# Patient Record
Sex: Female | Born: 1970 | Race: Asian | Hispanic: No | Marital: Married | Smoking: Never smoker
Health system: Southern US, Community
[De-identification: ages and names within clinical notes are randomized; demographics above are authoritative.]

## PROBLEM LIST (undated history)

## (undated) DIAGNOSIS — K219 Gastro-esophageal reflux disease without esophagitis: Secondary | ICD-10-CM

## (undated) DIAGNOSIS — E079 Disorder of thyroid, unspecified: Secondary | ICD-10-CM

## (undated) DIAGNOSIS — I7300 Raynaud's syndrome without gangrene: Secondary | ICD-10-CM

## (undated) HISTORY — DX: Gastro-esophageal reflux disease without esophagitis: K21.9

## (undated) HISTORY — DX: Disorder of thyroid, unspecified: E07.9

## (undated) HISTORY — DX: Raynaud's syndrome without gangrene: I73.00

---

## 2006-06-27 LAB — CYTOLOGY - PAP: PAP SMEAR: NEGATIVE

## 2009-04-21 LAB — CBC WITH DIFFERENTIAL
Basophils Absolute: 0 /uL
Eosinophils Absolute: 0 /uL
HCT: 40 %
Hgb Other: 13.4
LYMPHO ABS: 2 /uL
MCV: 85.8
Monocytes Absolute: 0 /uL
NEUTRO ABS: 2 /uL
PLATELETS: 153
RBC: 4.68
RDW: 13.1
WBC: 3.9

## 2009-04-21 LAB — POTASSIUM (ARMC VASCULAR LAB ONLY)
Bicarbonate: 30
CHLORIDE: 99 mmol/L
Potassium: 3.9 mmol/L
Sodium: 139

## 2009-04-21 LAB — SEDIMENTATION RATE: SED RATE: 14

## 2009-04-21 LAB — LIPID PANEL WITH LDL/HDL RATIO
Cholesterol: 147
HDL: 28 mg/dL — AB (ref 35–70)
LDL (calc): 90
Triglycerides: 144

## 2009-04-21 LAB — GLUCOSE, FASTING: Glucose: 90

## 2009-04-21 LAB — HM PAP SMEAR: PAP SMEAR: NEGATIVE

## 2009-04-21 LAB — TSH: TSH: 4.84

## 2009-04-26 LAB — ANTI-NUCLEAR AB-TITER (ANA TITER): ANA Titer 1: 1:160 {titer}

## 2009-04-26 LAB — ANA: ANA: POSITIVE

## 2010-08-31 LAB — LIPID PANEL
Cholesterol: 126
HDL: 19
LDL Calculated: 72 mg/dL
Triglycerides: 173

## 2010-08-31 LAB — HEMOGLOBIN A1C: Hemoglobin A1C: 5.8

## 2011-01-08 LAB — CENTROMERE ANTIBODIES: CENTROMERE AB: 121

## 2012-05-06 LAB — PAP, THINPREP RFLX HPV
HPV, HIGH-RISK: NOT DETECTED
PAP SMEAR: NEGATIVE

## 2012-06-18 LAB — LIPID PANEL
CHOLESTEROL: 148
HDL: 35
LDL: 89
Triglycerides: 122

## 2012-06-18 LAB — TSH: TSH: 0.29

## 2012-06-18 LAB — T3, FREE: T3 FREE INDEX: 2.5

## 2012-06-18 LAB — GLUCOSE, FASTING: Glucose: 84

## 2012-08-01 IMAGING — US US OUTSIDE FILMS BODY
1 series · 2 of 2 positions shown · non-contrast
Comparison: none

[Series 1: 76645.0 us breast unilateral left · 2 of 2 slices shown]
[im 1/2]
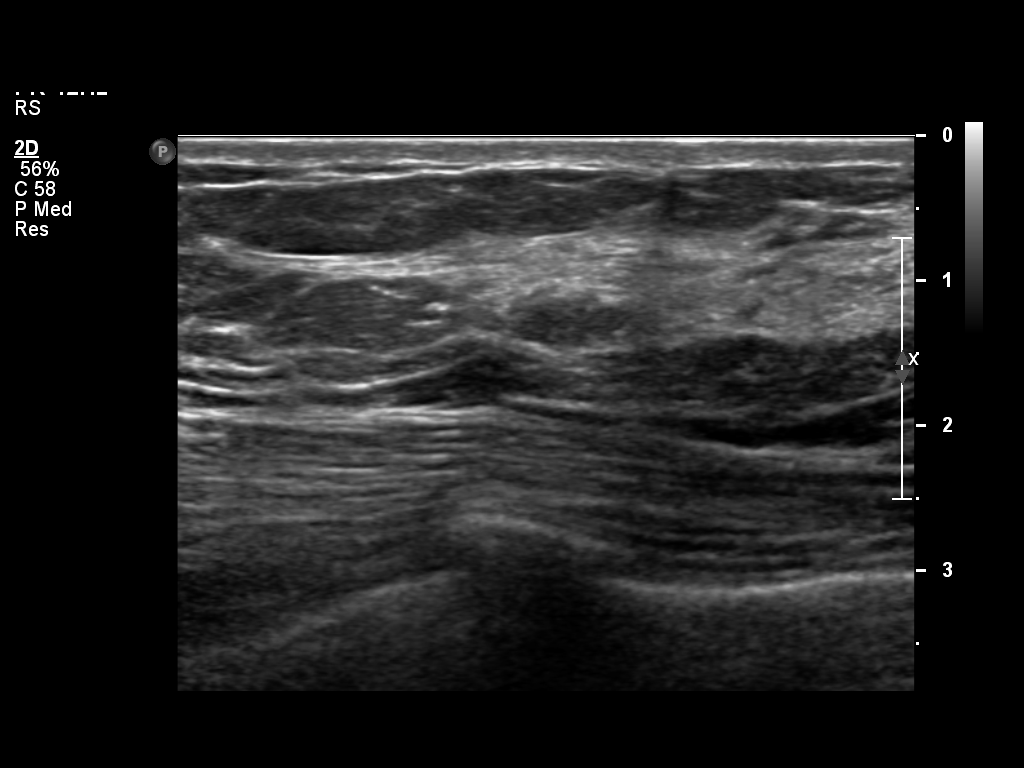
[im 2/2]
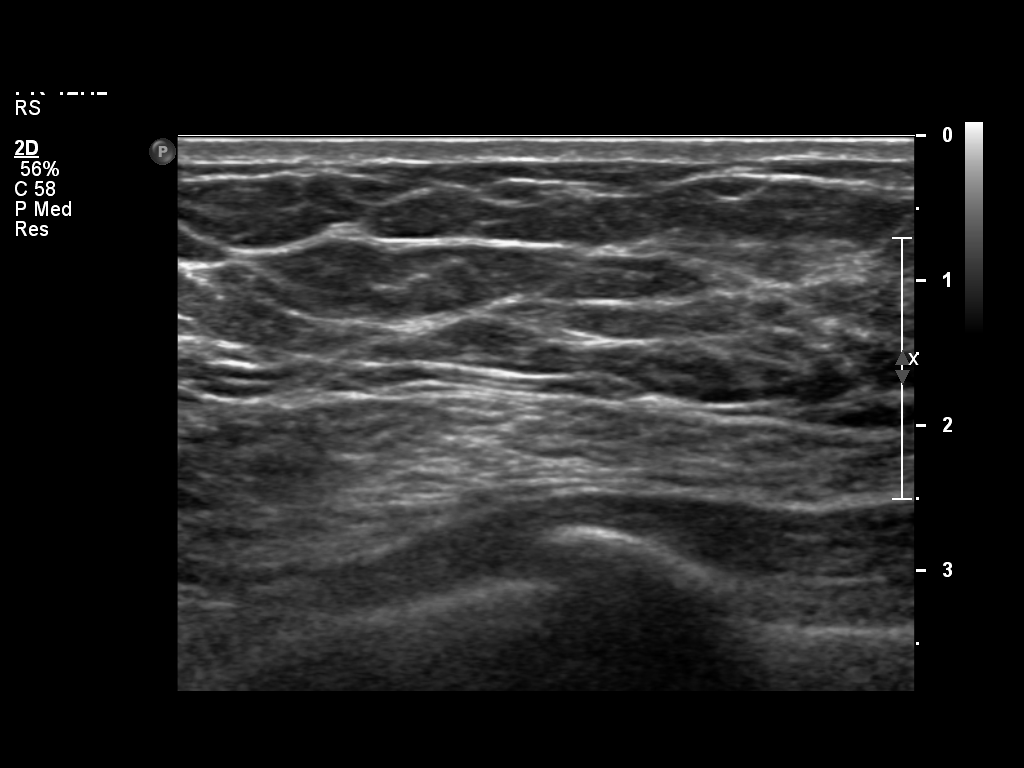

[2 of 2 positions shown; findings below may reference images not displayed]

Canned report from images found in remote index.

Refer to host system for actual result text.

## 2014-12-11 LAB — TSH+FREE T4
T3 TOTAL: 1.07
T4 TOTAL: 9.81
TSH: 0.005

## 2014-12-12 LAB — FUNGAL STAIN: KOH Prep: NEGATIVE

## 2015-01-07 ENCOUNTER — Ambulatory Visit: Payer: Self-pay | Admitting: Nurse Practitioner

## 2015-01-12 ENCOUNTER — Ambulatory Visit (INDEPENDENT_AMBULATORY_CARE_PROVIDER_SITE_OTHER): Payer: 59 | Admitting: Nurse Practitioner

## 2015-01-12 VITALS — BP 105/71 | HR 67 | Temp 98.5°F | Ht 61.0 in | Wt 116.0 lb

## 2015-01-12 DIAGNOSIS — I7300 Raynaud's syndrome without gangrene: Secondary | ICD-10-CM

## 2015-01-12 DIAGNOSIS — Z1239 Encounter for other screening for malignant neoplasm of breast: Secondary | ICD-10-CM

## 2015-01-12 DIAGNOSIS — Z23 Encounter for immunization: Secondary | ICD-10-CM

## 2015-01-12 MED ORDER — AMLODIPINE BESYLATE 2.5 MG PO TABS: 2.5000 mg | ORAL_TABLET | Freq: Every day | ORAL | Status: DC

## 2015-01-12 NOTE — Patient Instructions (Addendum)
I will review records. Please forward derm labs to my email:    [email or web address]  We will mail [facility] orders.  Please start norvasc. Take at bedtime. Take for at least 2 weeks to allow for resolve of any side effect such as headache.   Please get mammogram.  Pap & pelvic exam in 1 year.  Develop lifelong habits of exercise most days of the week: take a 30 minute walk. The benefits include weight loss, lower risk for heart disease, diabetes, stroke, high blood pressure, lower rates of depression & dementia, better sleep quality & bone health.  See me in 6 weeks, call me if you have intolerable effects from norvasc, although I do not expect you will! Nice to see you!

## 2015-01-12 NOTE — Progress Notes (Signed)
Pre visit review using our clinic review tool, if applicable. No additional management support is needed unless otherwise documented below in the visit note. 

## 2015-01-18 ENCOUNTER — Telehealth: Payer: Self-pay | Admitting: Nurse Practitioner

## 2015-01-18 NOTE — Telephone Encounter (Signed)
Called and spoke with patient-Arm is still hurting from Tdap Vaccine that she received last night. Swelling is there, but is going down now slowly. I informed the patient that swelling can happen for 7-10 days after injection. Patient is going to try tylenol/motrin for the pain and ice to help with the swelling. She is going to call on Friday if it is not any better. Patient agrees with plan of care.

## 2015-01-18 NOTE — Telephone Encounter (Signed)
Pt.states she received a vaccine last week and it still hurts and the swelling keeps going up. Please call her at [phone] and let her know what to do.

## 2015-01-19 ENCOUNTER — Encounter: Payer: Self-pay | Admitting: Nurse Practitioner

## 2015-01-19 DIAGNOSIS — I7300 Raynaud's syndrome without gangrene: Secondary | ICD-10-CM | POA: Insufficient documentation

## 2015-01-19 DIAGNOSIS — Z1239 Encounter for other screening for malignant neoplasm of breast: Secondary | ICD-10-CM | POA: Insufficient documentation

## 2015-01-19 NOTE — Progress Notes (Signed)
Subjective:     Laura Faulkner is a 44 y.o. female and is here for a comprehensive physical exam. The patient reports problems - raynaud's & thyroid disease.. Raynaud's: onset-6 yrs ago. Hands & feet involved. Evaluated by neuro. Started on nifedipine, but stopped due to intolerance-HA. Pt reports worsened symptoms in last few mos. Experiences mild-moderate pain, Diminished sensation finger tips & toes, pigment changes. Evaluated by rheum & derm in [place]. Recent nail scraping & labs performed. She will email lab & path results. Denies joint pain, dry eyes, frequent diarrhea, rash. thyroid disease: Her father (physician) performed  recent labs while in [place] & decreased dose levothyroxine. Denies palpitations, wt loss. Has had irreg [facility] for about 2 yrs with cycles getting longer & lighter.   History   Social History  . Marital Status: Married    Spouse Name: N/A  . Number of Children: 2  . Years of Education: N/A   Occupational History  .      QA analyst   Social History Main Topics  . Smoking status: Never Smoker   . Smokeless tobacco: Never Used  . Alcohol Use: No  . Drug Use: No  . Sexual Activity: Yes   Other Topics Concern  . Not on file   Social History Narrative   Ms Houchin lives with her husband & 2 children. 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    [employer] Date Due  . HIV Screening  04/01/1987  . INFLUENZA VACCINE  06/05/2014  . PAP SMEAR  01/19/2016  . TETANUS/TDAP  01/11/2025    The following portions of the patient's history were reviewed and updated as appropriate: allergies, current medications, past family history, past medical history, past social history, past surgical history and problem list.  Review of Systems Constitutional: negative for fevers, night sweats and weight loss Eyes: negative for visual disturbance and wears glasses Ears, nose, mouth, throat, and face: negative for hoarseness, nasal congestion and sore  throat Respiratory: negative for cough and wheezing Cardiovascular: negative for irregular heart beat and lower extremity edema Gastrointestinal: negative for abdominal pain, change in bowel habits, constipation, diarrhea, dysphagia and reflux symptoms Genitourinary:negative for vaginal discharge Neurological: negative for headaches Behavioral/Psych: negative for sleep disturbance Endocrine: negative for diabetic symptoms including polydipsia, polyphagia and polyuria and temperature intolerance   Objective:    BP 105/71 mmHg  Pulse 67  Temp(Src) 98.5 F (36.9 C) (Temporal)  Ht 5' 1"  (1.549 m)  Wt 116 lb (52.617 kg)  BMI 21.93 kg/m2  SpO2 100% General appearance: alert, cooperative, appears stated age and no distress Head: Normocephalic, without obvious abnormality, atraumatic Eyes: negative findings: lids and lashes normal, conjunctivae and sclerae normal, corneas clear and pupils equal, round, reactive to light and accomodation Ears: normal TM's and external ear canals both ears Throat: lips, mucosa, and tongue normal; teeth and gums normal Neck: no adenopathy, no carotid bruit, supple, symmetrical, trachea midline and thyroid not enlarged, symmetric, no tenderness/mass/nodules Lungs: clear to auscultation bilaterally Heart: regular rate and rhythm, S1, S2 normal, no murmur, click, rub or gallop Abdomen: soft, non-tender; bowel sounds normal; no masses,  no organomegaly Extremities: no edema, redness or tenderness in the calves or thighs and Hands: diffusely swollen. pigment changes in fingers. Nails deformed. Hands warm. Toes mild swelling, blue. tips- cool, nails slight deformity  Pulses: 2+ and symmetric Skin: see above, pigment changes at neck also Lymph nodes: Cervical adenopathy: none and Supraclavicular adenopathy: none Neurologic: Grossly normal  Assessment:Plan  1. Raynaud disease Significant ZM:[id number], SLE, cryoglobulinemia Pt will email recent labs Look for  CK, ACE, ANA, ESR - amLODipine (NORVASC) 2.5 MG tablet; Take 1 tablet (2.5 mg total) by mouth daily.  Dispense: 30 tablet; Refill: 1  2. Breast cancer screening - MM DIGITAL SCREENING BILATERAL; Future  3. Need for Tdap vaccination - Tdap vaccine greater than or equal to 7yo IM  F/u 6 wks Additional labs: CBC, CMET, urine micro, TSH, vit D, B12, cryoglobulins

## 2015-01-19 NOTE — Assessment & Plan Note (Signed)
Start low dose norvasc 2.5 mg qhs F/u 3 weeks.

## 2015-01-26 ENCOUNTER — Ambulatory Visit: Payer: Self-pay

## 2015-02-14 ENCOUNTER — Other Ambulatory Visit: Payer: Self-pay | Admitting: Nurse Practitioner

## 2015-02-14 DIAGNOSIS — R7989 Other specified abnormal findings of blood chemistry: Secondary | ICD-10-CM

## 2015-02-14 DIAGNOSIS — R768 Other specified abnormal immunological findings in serum: Secondary | ICD-10-CM

## 2015-02-14 DIAGNOSIS — L989 Disorder of the skin and subcutaneous tissue, unspecified: Secondary | ICD-10-CM

## 2015-02-15 ENCOUNTER — Telehealth: Payer: Self-pay | Admitting: Nurse Practitioner

## 2015-02-15 DIAGNOSIS — R7989 Other specified abnormal findings of blood chemistry: Secondary | ICD-10-CM

## 2015-02-15 DIAGNOSIS — L989 Disorder of the skin and subcutaneous tissue, unspecified: Secondary | ICD-10-CM

## 2015-02-15 DIAGNOSIS — R768 Other specified abnormal immunological findings in serum: Secondary | ICD-10-CM

## 2015-02-15 NOTE — Telephone Encounter (Signed)
Done

## 2015-02-15 NOTE — Progress Notes (Signed)
I have printed lab req's per Laura Faulkner's request. Waiting to hear if patient will come get them or if we are going to fax them

## 2015-02-15 NOTE — Telephone Encounter (Signed)
Reviewed labs from [place] performed within last 3 mos: pt has elevation of ab to centromere proteins & proliferating cell nuclear antigen. Raises suspicion for SLE & scleroderma although she is neg for Smith ab, ds DNA, ro, la, Scl-70. Nail scraping performed-no fungus Thyroid tests: TSH low 0.005, T3 & T4 nml.  Repeat CMET, CBC, TSH, T4, TPO, urine micro, ANA, ESR, ACE, vit D . [facility] orders mailed to pt.  Discussed w/pt. Answered all questions. She started amlodipine-had HA 1st 2 days. Has not noticed improvement in fingertips. Denies SOB, palpitations. F/u in 2 weeks Will ref to rheum after I get lab results.

## 2015-02-17 ENCOUNTER — Other Ambulatory Visit: Payer: Self-pay | Admitting: Nurse Practitioner

## 2015-02-17 ENCOUNTER — Encounter: Payer: Self-pay | Admitting: Nurse Practitioner

## 2015-02-17 DIAGNOSIS — N926 Irregular menstruation, unspecified: Secondary | ICD-10-CM | POA: Insufficient documentation

## 2015-02-17 DIAGNOSIS — L989 Disorder of the skin and subcutaneous tissue, unspecified: Secondary | ICD-10-CM

## 2015-02-17 DIAGNOSIS — R768 Other specified abnormal immunological findings in serum: Secondary | ICD-10-CM

## 2015-02-17 NOTE — Progress Notes (Signed)
Called and spoke with patient. She has not went to [facility] yet, so I will mail other 2 orders. Her MC are on/off but the first day of her last one was March 28,2016.

## 2015-03-01 ENCOUNTER — Encounter: Payer: Self-pay | Admitting: Nurse Practitioner

## 2015-03-01 DIAGNOSIS — Z8739 Personal history of other diseases of the musculoskeletal system and connective tissue: Secondary | ICD-10-CM | POA: Insufficient documentation

## 2015-03-01 DIAGNOSIS — Z124 Encounter for screening for malignant neoplasm of cervix: Secondary | ICD-10-CM | POA: Insufficient documentation

## 2015-03-01 DIAGNOSIS — Z8719 Personal history of other diseases of the digestive system: Secondary | ICD-10-CM | POA: Insufficient documentation

## 2015-03-02 ENCOUNTER — Ambulatory Visit: Payer: 59 | Admitting: Nurse Practitioner

## 2015-03-10 ENCOUNTER — Telehealth: Payer: Self-pay | Admitting: Nurse Practitioner

## 2015-03-10 NOTE — Telephone Encounter (Signed)
Ret call LM

## 2015-03-11 ENCOUNTER — Telehealth: Payer: Self-pay | Admitting: Nurse Practitioner

## 2015-03-11 ENCOUNTER — Other Ambulatory Visit: Payer: Self-pay | Admitting: Nurse Practitioner

## 2015-03-11 DIAGNOSIS — R7689 Other specified abnormal immunological findings in serum: Secondary | ICD-10-CM

## 2015-03-11 DIAGNOSIS — L989 Disorder of the skin and subcutaneous tissue, unspecified: Secondary | ICD-10-CM

## 2015-03-11 DIAGNOSIS — R768 Other specified abnormal immunological findings in serum: Secondary | ICD-10-CM

## 2015-03-11 NOTE — Progress Notes (Signed)
Spoke with pt, she has not had labs performed yet. She will get done next week. I will defer some labs to rheum. 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 Pt in agreement to see rheum.  Did not understand why she had copay at last OV, as she scheduled it as CPE. Explained because I addressed her sclerotic fingers. Answered all questions.  I will call with lab results.

## 2015-03-14 ENCOUNTER — Encounter: Payer: Self-pay | Admitting: Nurse Practitioner

## 2015-03-16 ENCOUNTER — Ambulatory Visit: Payer: 59 | Admitting: Nurse Practitioner

## 2015-03-21 LAB — CBC WITH DIFFERENTIAL/PLATELET
BASOS ABS: 0
Basophils Absolute: 0 /uL
EOS: 0 %
EOS: 2 %
GRANS (ABSOLUTE): 0
Granulocytes:: 0
HCT: 42 %
HEMOGLOBIN: 14.3 g/dL
LYMPH: 45 %
Lymphs(Absolute): 3.2
MCH: 27.5
MCHC: 33.9
MCV: 81
MONOCYTES(ABSOLUTE): 0.5
Monocytes: 7
Neutrophils Absolute: 3 /uL
Neutrophils: 46
Platelet: 212
RBC: 5.2
RDW: 14.4
WBC: 7.2

## 2015-03-21 LAB — MICROALBUMIN / CREATININE URINE RATIO
CREATININE UR: 50.5 mg/dL
Microalb Creat Ratio: <5.9
Microalb, Ur: <3

## 2015-03-21 LAB — COMPREHENSIVE METABOLIC PANEL
A/G RATIO: 1.3
ALK PHOS: 106
ALT: 12
AST: 20 U/L
Albumin Serum: 4.4
BILIRUBIN TOTAL: 0.3 mg/dL
BUN / CREAT RATIO: 16
BUN: 14
CHLORIDE, SERUM: 102
Calcium, Ser: 9.9
Carbon Dioxide, Total: 22
Creatine, Serum: 0.85
GFR CALC AF AMER: 98
GFR CALC NON AF AMER: 85
Globulin, Total: 3.4
Glucose: 85
POTASSIUM, SERUM: 5.1
Protein, Total: 7.8
SODIUM, SERUM: 143

## 2015-03-21 LAB — URINALYSIS, MICROSCOPIC ONLY: Casts: NONE SEEN

## 2015-03-21 LAB — TSH+FREE T4
TSH: 0.061
Thyroxine (T4): 1.88

## 2015-03-21 LAB — THYROID PEROXIDASE (TPO) AB: THYROID PEROXIDASE (TPO) AB: 350

## 2015-03-21 LAB — VITAMIN D 25 HYDROXY (VIT D DEFICIENCY, FRACTURES): VIT D 25 HYDROXY: 13.5

## 2015-03-22 ENCOUNTER — Telehealth: Payer: Self-pay | Admitting: Nurse Practitioner

## 2015-03-22 DIAGNOSIS — E559 Vitamin D deficiency, unspecified: Secondary | ICD-10-CM

## 2015-03-22 DIAGNOSIS — E039 Hypothyroidism, unspecified: Secondary | ICD-10-CM

## 2015-03-22 MED ORDER — VITAMIN D3 1.25 MG (50000 UT) PO CAPS: 1.0000 | ORAL_CAPSULE | ORAL | Status: AC

## 2015-03-22 NOTE — Telephone Encounter (Signed)
pls call pt: Advise Lab concerns: she is getting too much thyroid hormone. Decrease current dose to 5 days week. If she is running out, I will send in script for smaller dose that she will take daily. She has active autoimmune thyroid disease. This means her numbers are likely to fluctuate & she needs frequent monitoring. She will need to repeat Thyroid labs in 6 weeks. I will place orders, Pls mail to her. Vit D very low: Start prescription vitamin D. Take 1 capsule weekly for 12 weeks. Level will be checked again in 3 mos. Pls sched OV in 3 mos.

## 2015-03-23 NOTE — Telephone Encounter (Signed)
LMOVM-identified about lab results. Patient to call back with any questions or concerns. DPR Signed.  

## 2015-04-06 ENCOUNTER — Other Ambulatory Visit: Payer: Self-pay

## 2015-04-06 DIAGNOSIS — I7300 Raynaud's syndrome without gangrene: Secondary | ICD-10-CM

## 2015-04-06 MED ORDER — AMLODIPINE BESYLATE 2.5 MG PO TABS: 2.5000 mg | ORAL_TABLET | Freq: Every day | ORAL | Status: AC

## 2015-04-06 NOTE — Telephone Encounter (Signed)
Please Advise Refill Request? Refill request for- Amlodipine Last filled by MD on - 01/12/15 Last Appt - 01/12/15        Next Appt - None scheduled (patient is going to make one for 3 months out from May) Pharmacy- [facility]

## 2015-04-08 NOTE — Telephone Encounter (Signed)
error 

## 2015-05-19 ENCOUNTER — Other Ambulatory Visit: Payer: Self-pay | Admitting: Family Medicine

## 2015-05-19 DIAGNOSIS — I7300 Raynaud's syndrome without gangrene: Secondary | ICD-10-CM

## 2015-05-19 NOTE — Telephone Encounter (Signed)
Pt requesting 90 day supply to go to [facility].

## 2015-05-19 NOTE — Telephone Encounter (Signed)
Spoke to pt.  Dr. Corliss Skainseveshwar did Rx amlodipine.  Pt will have request faxed to that office.

## 2015-05-19 NOTE — Telephone Encounter (Signed)
pls check w/pt to see if dr Corliss Skainseveshwar prescribed this medicine-I recall reading in her note that she changed dose. Of course I cannot find the scanned OV note in the chart!

## 2015-05-20 ENCOUNTER — Telehealth (HOSPITAL_COMMUNITY): Payer: Self-pay | Admitting: Vascular Surgery

## 2015-05-20 NOTE — Telephone Encounter (Signed)
Left pt a message to give me a call back to schedule new pt appt for PULM HTN

## 2015-05-26 ENCOUNTER — Telehealth (HOSPITAL_COMMUNITY): Payer: Self-pay | Admitting: Vascular Surgery

## 2015-05-26 NOTE — Telephone Encounter (Signed)
Left message to get pt scheduled as a new pt

## 2015-06-17 NOTE — Telephone Encounter (Signed)
Going to close encounter . Left pt several messages to make new pt appt, no response
